# Patient Record
Sex: Female | Born: 1950 | Race: White | Hispanic: No | Smoking: Former smoker
Health system: Southern US, Community
[De-identification: ages and names within clinical notes are randomized; demographics above are authoritative.]

## PROBLEM LIST (undated history)

## (undated) DIAGNOSIS — I82409 Acute embolism and thrombosis of unspecified deep veins of unspecified lower extremity: Secondary | ICD-10-CM

## (undated) DIAGNOSIS — F329 Major depressive disorder, single episode, unspecified: Secondary | ICD-10-CM

## (undated) DIAGNOSIS — I499 Cardiac arrhythmia, unspecified: Secondary | ICD-10-CM

## (undated) DIAGNOSIS — F32A Depression, unspecified: Secondary | ICD-10-CM

## (undated) DIAGNOSIS — Z8639 Personal history of other endocrine, nutritional and metabolic disease: Secondary | ICD-10-CM

## (undated) DIAGNOSIS — I10 Essential (primary) hypertension: Secondary | ICD-10-CM

## (undated) DIAGNOSIS — F419 Anxiety disorder, unspecified: Secondary | ICD-10-CM

## (undated) DIAGNOSIS — Z86718 Personal history of other venous thrombosis and embolism: Secondary | ICD-10-CM

## (undated) DIAGNOSIS — I739 Peripheral vascular disease, unspecified: Secondary | ICD-10-CM

## (undated) HISTORY — PX: TUBAL LIGATION: SHX77

## (undated) HISTORY — DX: Acute embolism and thrombosis of unspecified deep veins of unspecified lower extremity: I82.409

## (undated) HISTORY — DX: Personal history of other endocrine, nutritional and metabolic disease: Z86.39

## (undated) HISTORY — PX: ANKLE SURGERY: SHX546

## (undated) HISTORY — DX: Personal history of other venous thrombosis and embolism: Z86.718

## (undated) HISTORY — PX: BREAST SURGERY: SHX581

---

## 1998-11-04 ENCOUNTER — Other Ambulatory Visit: Admission: RE | Admit: 1998-11-04 | Discharge: 1998-11-04 | Payer: Self-pay | Admitting: Radiology

## 1999-01-09 ENCOUNTER — Other Ambulatory Visit: Admission: RE | Admit: 1999-01-09 | Discharge: 1999-01-09 | Payer: Self-pay | Admitting: Radiology

## 1999-02-05 ENCOUNTER — Other Ambulatory Visit: Admission: RE | Admit: 1999-02-05 | Discharge: 1999-02-05 | Payer: Self-pay | Admitting: Surgery

## 1999-07-04 ENCOUNTER — Other Ambulatory Visit: Admission: RE | Admit: 1999-07-04 | Discharge: 1999-07-04 | Payer: Self-pay | Admitting: *Deleted

## 2003-10-11 ENCOUNTER — Ambulatory Visit: Admission: RE | Admit: 2003-10-11 | Discharge: 2003-10-11 | Payer: Self-pay | Admitting: Family Medicine

## 2003-10-12 ENCOUNTER — Encounter (HOSPITAL_COMMUNITY): Admission: RE | Admit: 2003-10-12 | Discharge: 2003-10-15 | Payer: Self-pay | Admitting: Oncology

## 2004-04-03 ENCOUNTER — Encounter: Admission: RE | Admit: 2004-04-03 | Discharge: 2004-04-03 | Payer: Self-pay | Admitting: Internal Medicine

## 2004-05-30 ENCOUNTER — Observation Stay (HOSPITAL_COMMUNITY): Admission: EM | Admit: 2004-05-30 | Discharge: 2004-05-31 | Payer: Self-pay | Admitting: Emergency Medicine

## 2005-03-09 ENCOUNTER — Ambulatory Visit (HOSPITAL_COMMUNITY): Admission: RE | Admit: 2005-03-09 | Discharge: 2005-03-09 | Payer: Self-pay | Admitting: Gastroenterology

## 2005-03-09 ENCOUNTER — Encounter (INDEPENDENT_AMBULATORY_CARE_PROVIDER_SITE_OTHER): Payer: Self-pay | Admitting: *Deleted

## 2005-06-15 ENCOUNTER — Encounter: Admission: RE | Admit: 2005-06-15 | Discharge: 2005-06-15 | Payer: Self-pay | Admitting: Internal Medicine

## 2006-04-16 IMAGING — CR DG KNEE COMPLETE 4+V*L*
4 series · 4 of 4 positions shown · non-contrast
Comparison: none

CLINICAL DATA: Fell 12 days ago.  Pain anterior knee and bruising from knee down to ankle.
 LEFT KNEE COMPLETE ? FOUR VIEWS:
 There is no evidence of fracture, dislocation, or other significant bone abnormality.  There is no evidence of joint effusion.

[view not recorded (1 of 4)]
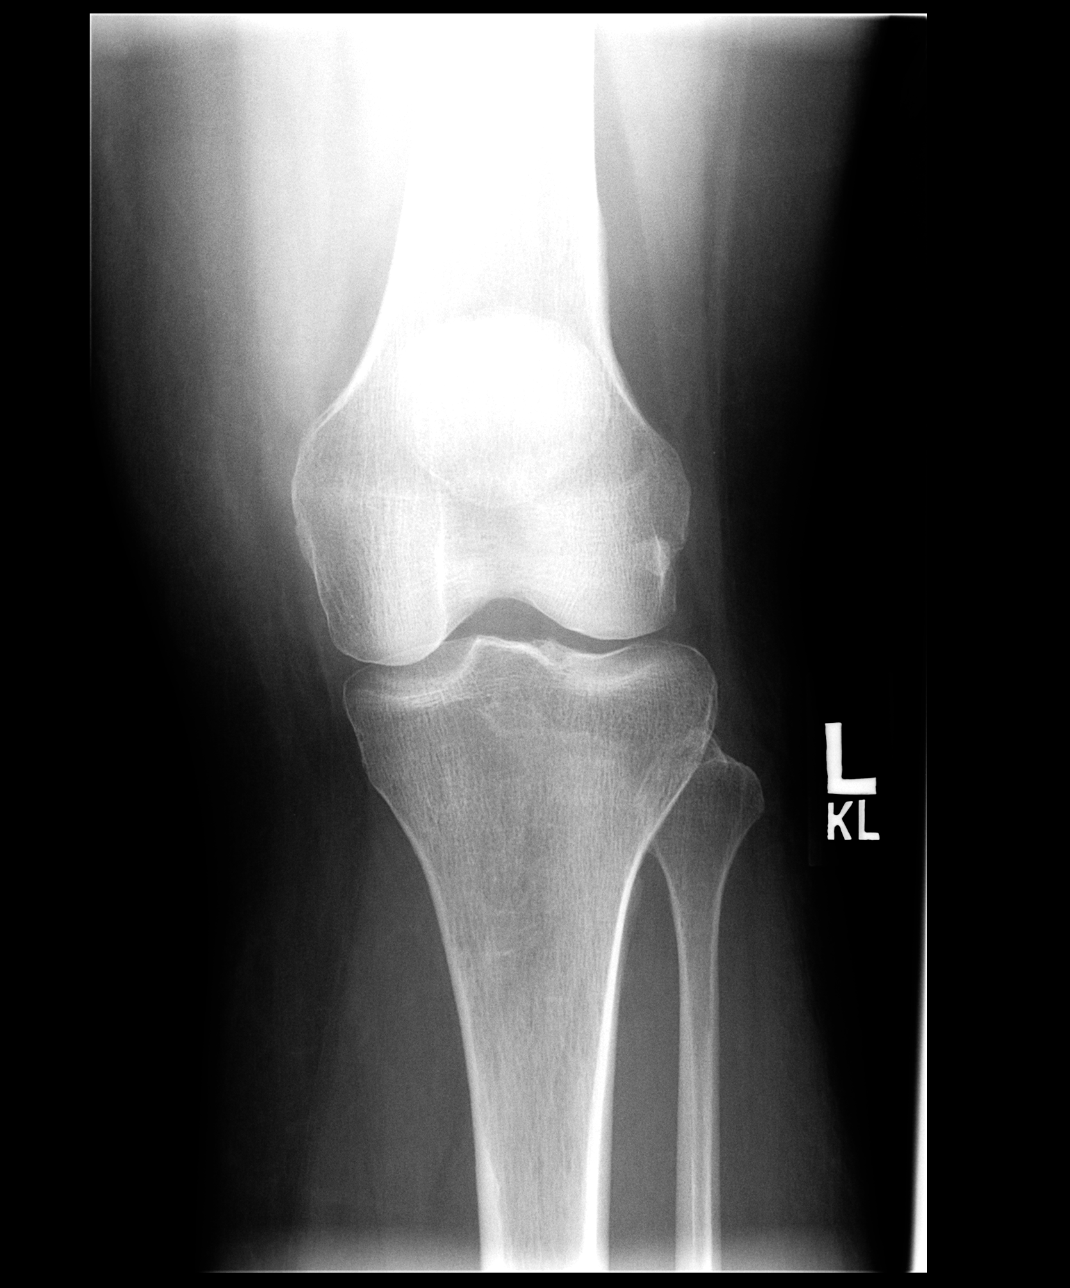

[view not recorded (2 of 4)]
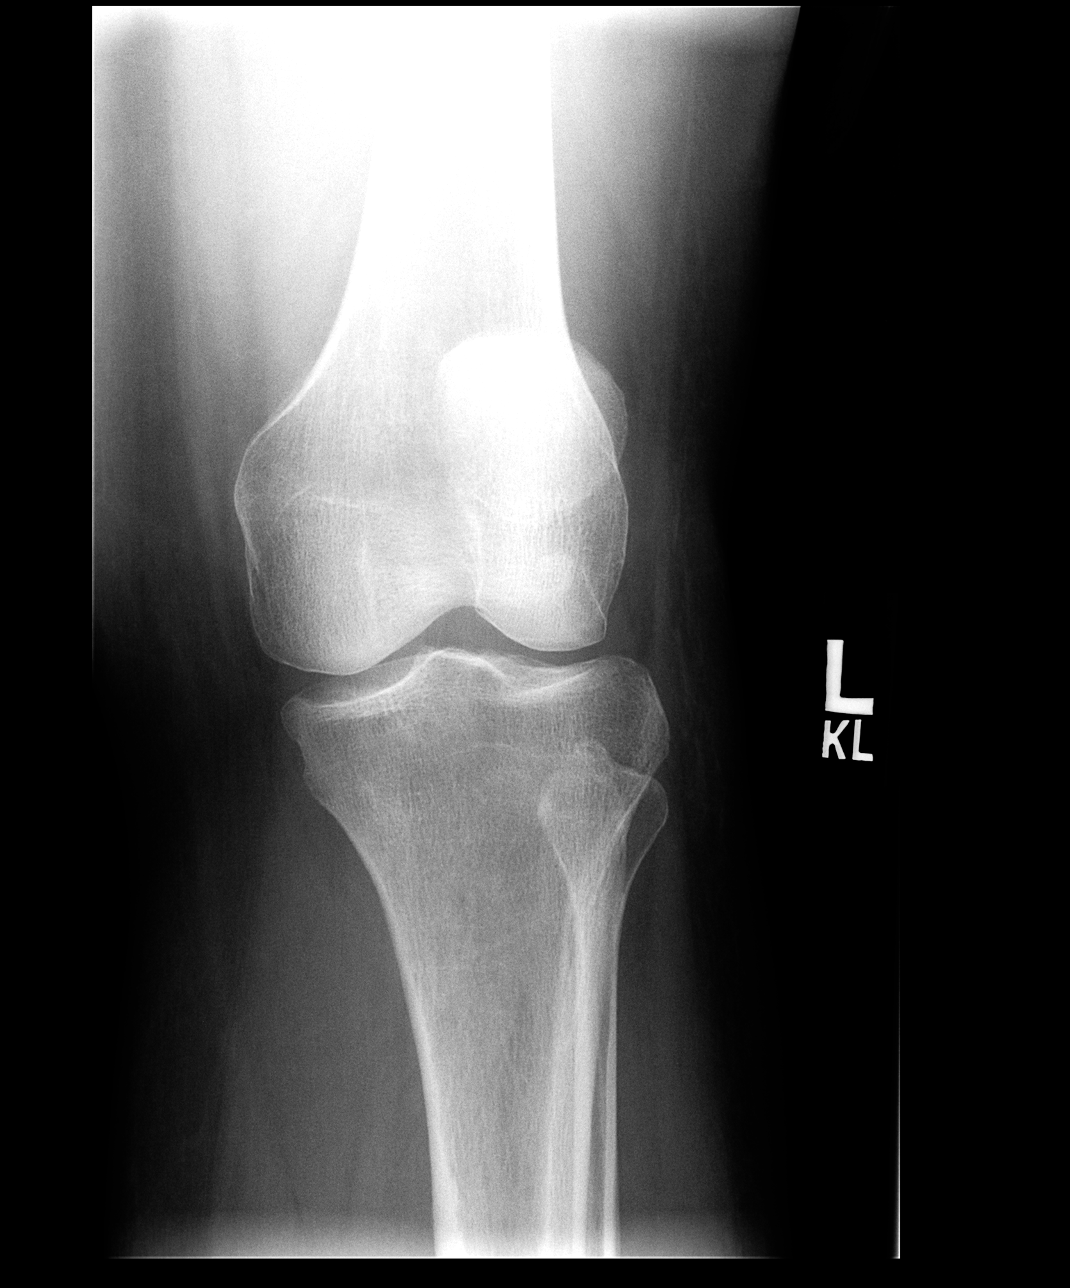

[view not recorded (3 of 4)]
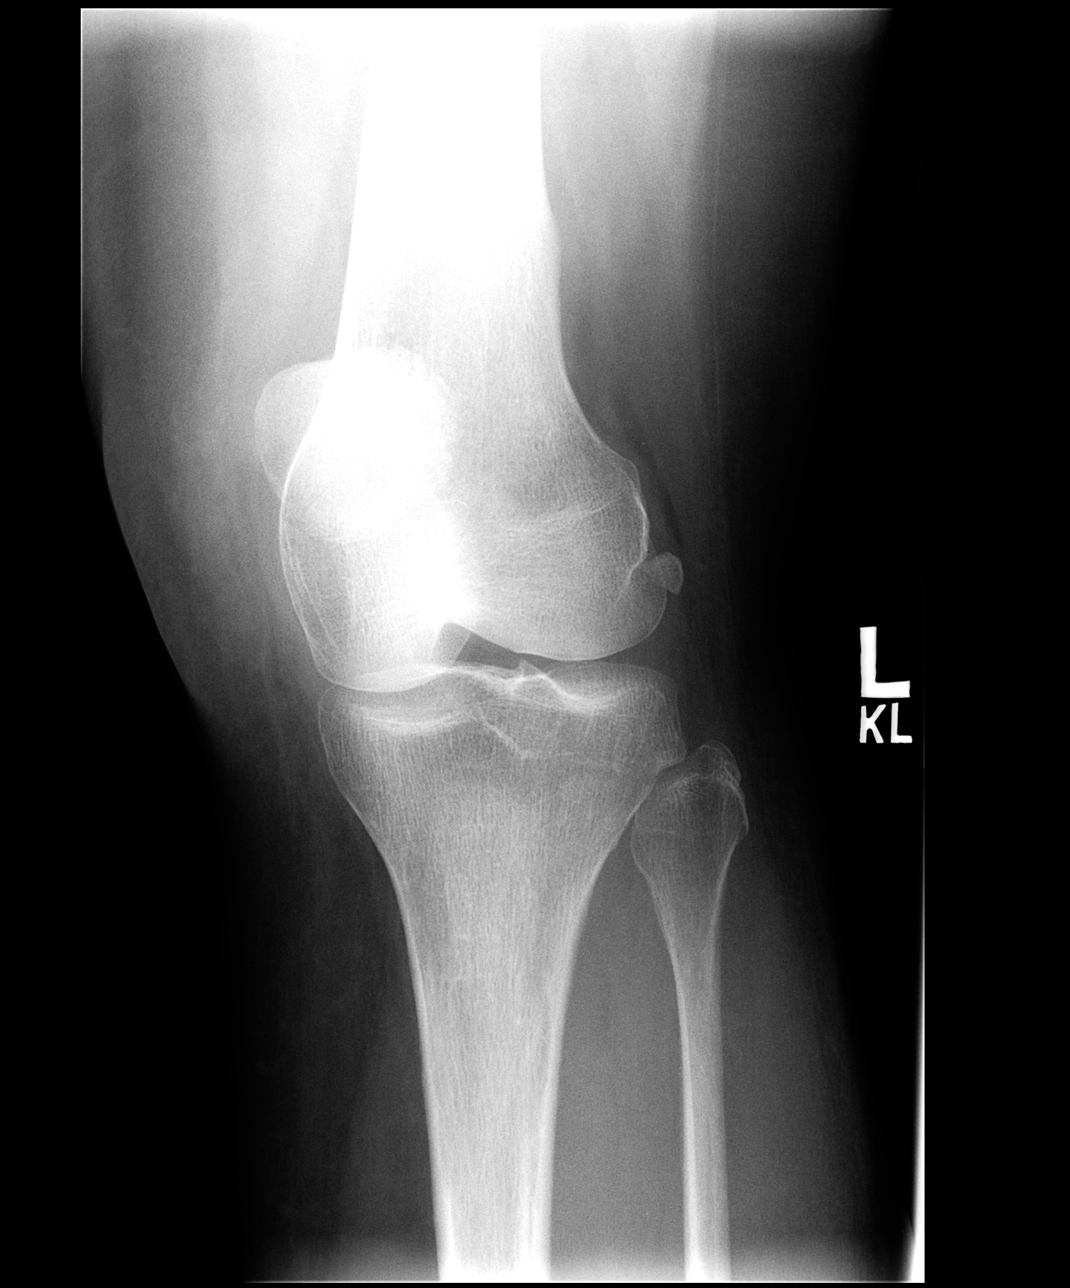

[view not recorded (4 of 4)]
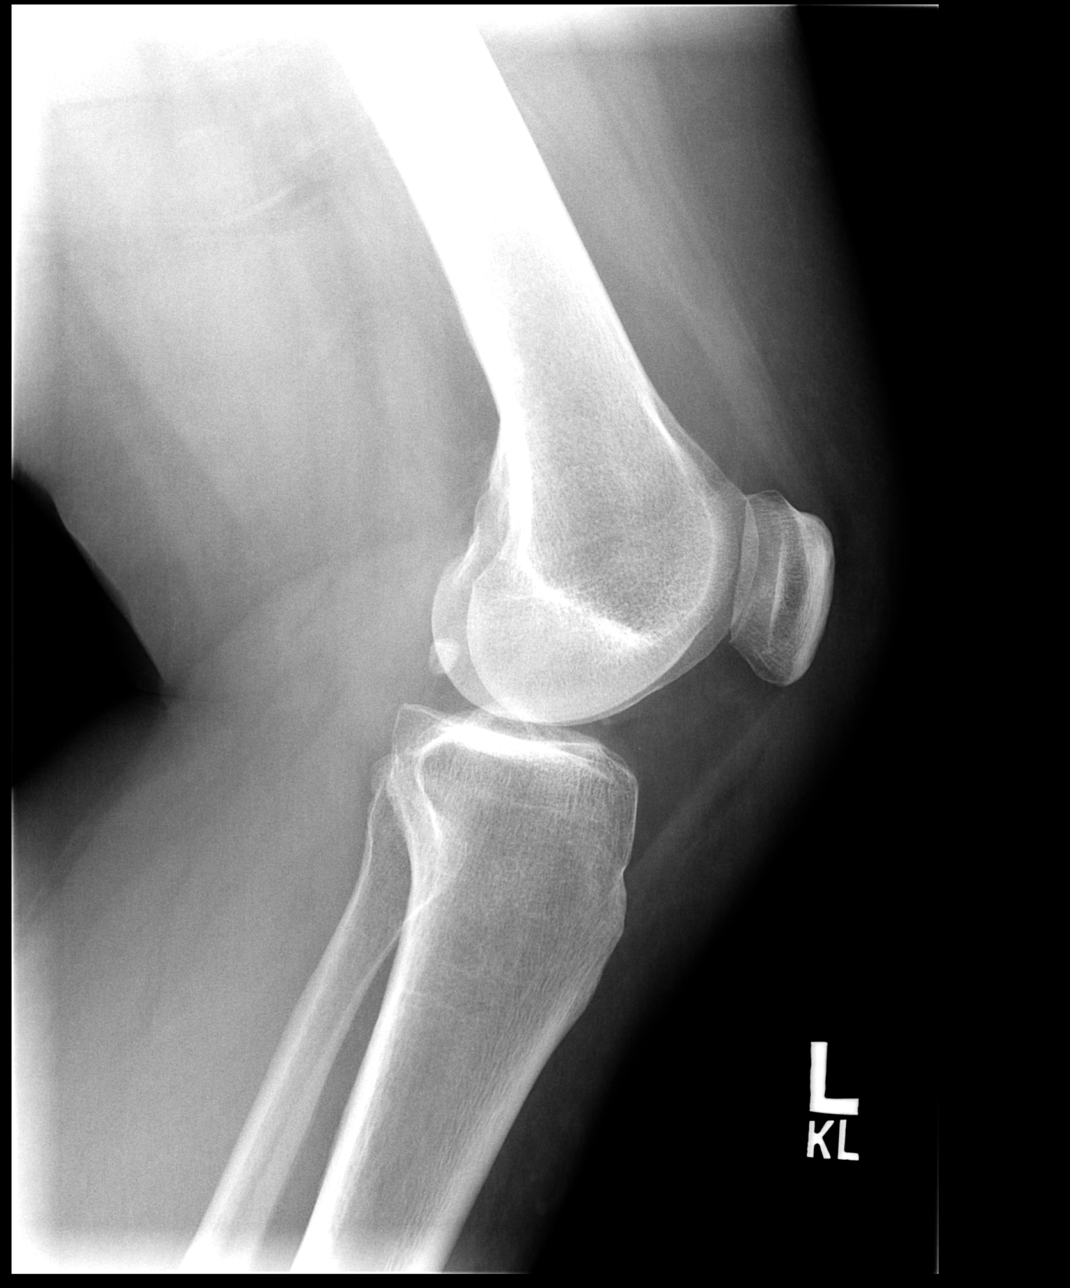

[4 of 4 positions shown; findings below may reference images not displayed]

IMPRESSION: Normal study.

## 2008-02-13 ENCOUNTER — Other Ambulatory Visit: Admission: RE | Admit: 2008-02-13 | Discharge: 2008-02-13 | Payer: Self-pay | Admitting: Obstetrics & Gynecology

## 2008-02-23 ENCOUNTER — Other Ambulatory Visit: Admission: RE | Admit: 2008-02-23 | Discharge: 2008-02-23 | Payer: Self-pay | Admitting: Obstetrics & Gynecology

## 2011-03-20 NOTE — H&P (Signed)
NAME:  Angelica Mclaughlin, Angelica Mclaughlin                           ACCOUNT NO.:  [id number]   MEDICAL RECORD NO.:  [id number]                   PATIENT TYPE:  EMS   LOCATION:  MAJO                                 FACILITY:  [facility]   PHYSICIAN:  Wilson Singer, M.D.             DATE OF BIRTH:  07/03/1951   DATE OF ADMISSION:  05/30/2004  DATE OF DISCHARGE:                                HISTORY & PHYSICAL   HISTORY:  This is a 60 year old lady who has a recent history of deep vein  thrombosis in December 2004, and is on Coumadin for this, now presents with  an episode of syncope.  She in fact relates a one year history of  palpitations associated with lightheadedness and the palpitations lasting  approximately 20 minutes.  She has never lost consciousness with any of the  episodes which occurred on a monthly basis, but on this occasion today she  did lose consciousness momentarily.  She denies any chest pain associated  with these palpitations.   PAST MEDICAL HISTORY:  Deep vein thrombosis on the left leg and also  previously on the right leg.  She has no other significant history.   MEDICATIONS:  1. Coumadin.  2. Effexor.   ALLERGIES:  No known drug allergies.   SOCIAL HISTORY:  She is a married lady who does not smoke and occasionally  drinks alcohol.  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   REVIEW OF SYSTEMS:  Apart from the symptoms mentioned above, there are no  other symptoms referable to the constitutional, cardiovascular, respiratory,  neurological, gastrointestinal, genitourinary, musculoskeletal, neurological  systems.   PHYSICAL EXAMINATION:  GENERAL:  She looks systemically well.  VITAL SIGNS:  She is afebrile.  She is hemodynamically stable.  HEART:  Heart sounds are present and normal.  There are no murmurs.  NECK:  No carotids.  LUNGS:  Lung fields are clear.  NEUROLOGIC:  She is alert and oriented with no focal neurological signs.   LABORATORY DATA:   Electrocardiogram shows normal sinus rhythm and is within  normal limits.  Blood work, including blood count and electrolytes are  reportedly to be normal, but I have not seen them myself yet, and will  search for these results.  Apparently, a CT scan of the brain was normal.   IMPRESSION AND PLAN:  Syncopal episode associated with palpitations.  I am  concerned that she does have a significant arrhythmia and requires morphine.  I discussed this patient with Dr. Amil Amen of cardiology who will see her  tomorrow and we will admit her overnight for telemetry monitoring.  We will  get serial cardiac enzymes to make sure that she has not had any ischemic  event.  Wilson Singer, M.D.    NCG/MEDQ  D:  05/30/2004  T:  05/30/2004  Job:  [id number]   cc:   Olene Craven, M.D.  [address]  [address]  [place]  [place] [address]  Fax: [phone]

## 2011-03-20 NOTE — Op Note (Signed)
[name], Angelica Mclaughlin                 ACCOUNT NO.:  [id number]   MEDICAL RECORD NO.:  [id number]          PATIENT TYPE:  AMB   LOCATION:  ENDO                         FACILITY:  [facility]   PHYSICIAN:  Anselmo Rod, M.D.  DATE OF BIRTH:  1951-01-01   DATE OF PROCEDURE:  03/09/2005  DATE OF DISCHARGE:                                 OPERATIVE REPORT   PROCEDURE PERFORMED:  Colonoscopy with snare polypectomy times one.   ENDOSCOPIST:  Charna Elizabeth, M.D.   INSTRUMENT USED:  Olympus video colonoscope.   INDICATIONS FOR PROCEDURE:  60 year old white female undergoing screening  colonoscopy to rule out colonic polyps, masses, etc.   PREPROCEDURE PREPARATION:  Informed consent was procured from the patient.  The patient was fasted for eight hours prior to the procedure and prepped  with a bottle of magnesium citrate and a gallon of GoLYTELY the night prior  to the procedure.  The risks and benefits of the procedure including a 10%  miss rate for polyps or cancers was discussed with the patient as well.  Her  Coumadin was held for five days prior to the procedure after procuring  permission from Dr. Barbee Shropshire to do so.   PREPROCEDURE PHYSICAL:  The patient had stable vital signs.  Neck supple.  Chest clear to auscultation.  S1 and S2 regular.  Abdomen soft with normal  bowel sounds.   DESCRIPTION OF PROCEDURE:  The patient was placed in left lateral decubitus  position and sedated with 100 mg of Demerol and 8 mg of Versed in slow  incremental doses given intravenously.  Once the patient was adequately  sedated and maintained on low flow oxygen and continuous cardiac monitoring,  the Olympus video colonoscope was advanced from the rectum to the cecum.  The appendicular orifice and ileocecal valve were visualized and  photographed.  There was some stool in the right side of the colon.  Multiple washes were done.  Small lesions could be missed.  A few scattered  diverticula were seen  throughout the colon.  A pedunculated polyp measuring  5 to 6 mm was snared from the rectosigmoid colon at 20 cm.  Small internal  hemorrhoids were seen on retroflexion.  The rest of the exam was  unremarkable. The patient tolerated the procedure well without immediate  complication.   IMPRESSION:  1.  Small nonbleeding internal hemorrhoids seen on retroflexion.  2.  Few scattered diverticula throughout the colon.  3.  Pedunculated polyp measuring 5 to 6 mm snared from 20 cm.  4.  Significant amount of residual stool in the right colon, multiple washes      done.  Small lesions could be missed.  5.  A pill seen in the cecal base   RECOMMENDATIONS:  1.  Await pathology results.  2.  Hold off on the Coumadin for another week.  3.  Outpatient followup as need arises in the future.  Repeat colonoscopy      depending on pathology results.      JNM/MEDQ  D:  03/09/2005  T:  03/09/2005  Job:  [id number]  cc:   Olene Craven, M.D.  [address]  [address]  [place]  [place] [address]  Fax: [phone]

## 2011-03-20 NOTE — Consult Note (Signed)
NAME:  Angelica Mclaughlin, Angelica Mclaughlin                           ACCOUNT NO.:  [id number]   MEDICAL RECORD NO.:  [id number]                   PATIENT TYPE:  INP   LOCATION:  5511                                 FACILITY:  [facility]   PHYSICIAN:  Francisca December, M.D.               DATE OF BIRTH:  Jun 19, 1951   DATE OF CONSULTATION:  05/31/2004  DATE OF DISCHARGE:                                   CONSULTATION   REASON FOR CONSULTATION:  Syncope.   HISTORY OF PRESENT ILLNESS:  Angelica Mclaughlin is a pleasant 60 year old woman who  yesterday noted the abrupt onset of tachypalpitations.  This is not a new  sensation for her.  She laid down in bed as she usually does, hoping that it  would resolve.  She abruptly got up to escort a guest to the door and in  doing so prompted an episode of syncope.  She fell to the floor and caused  epistaxis with some minor trauma around the orbit.  She does not remember  hitting the floor.  She remembers awakening on the floor with a bloody nose.  EMS was on the way.  She was transported to the emergency room with stable  vital signs.  She was admitted overnight for monitoring and rule out of  myocardial infarction, which has been accomplished.  Overnight telemetry has  not revealed any dysrhythmia.   She has been having these episodes of tachypalpitations for about a year.  They have become more frequent.  They had been happening once a month or  every other month.  Now in the last six to eight weeks, they have been  occurring one to two times per week.  As mentioned, they usually resolve  with her laying down and resting over about 15-20 minutes.  They are not  associated with dyspnea or chest discomfort.  She has noted lightheadedness  during the tachypalpitations, but not following resolution of this  sensation.   PAST MEDICAL HISTORY:  1. Hypertension.  2. History of deep venous thrombosis on systemic anticoagulation.  The last     episode was in December of 2004.  3.  Depression.   SURGICAL PROCEDURES:  Ankle surgery remotely.   CURRENT MEDICATIONS:  1. Warfarin, varying dosages.  2. Effexor, ? dose one p.o. daily.  3. Benicar 20 mg p.o. daily.   FAMILY HISTORY:  Not significant for early cardiac disease.   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  She quit smoking  in December of 2004.  She drinks one cocktail per night.  She lives with her  husband.   DRUG ALLERGIES:  None known.   REVIEW OF SYSTEMS:  Negative, except for as mentioned above.   PHYSICAL EXAMINATION:  VITAL SIGNS:  The blood pressure is 133/97, pulse is  70 and regular, respiratory rate 18, and temperature 97.2 degrees.  GENERAL APPEARANCE:  This  is a well-appearing 60 year old woman in no  distress.  HEENT:  Unremarkable.  The head is normocephalic and atraumatic.  The pupils  are equal, round, and reactive to light and accommodation.  Extraocular  movements are intact.  The oral mucosa is pink and moist.  The tongue is not  coated.  NECK:  Supple without thyromegaly or masses.  The carotid upstrokes are  normal.  There is no bruit.  There is no jugular venous distention.  CHEST:  Clear with adequate excursion.  Normal vesicular breath sounds are  heard throughout.  The precordium is quiet.  Normal S1 and S2.  No S3, S4,  murmur, click, or rub noted.  The PMI is not palpable.  ABDOMEN:  Flat, soft, and nontender without any hepatosplenomegaly or  midline pulsatile mass.  EXTREMITIES:  The lower extremities are without edema.  Distal pulses are  intact.  There is full range of motion.  NEUROLOGIC:  Cranial nerves II-XII are intact.  Motor and sensory grossly  intact.  Gait not tested.  SKIN:  Warm, dry, and clear.   ACCESSORY CLINICAL DATA:  The admission hemogram, serum electrolytes, BUN,  creatinine, and glucose are normal.  The INR is 1.8 and PT 18.5.  Liver  associated enzymes were normal.  CK-MB and troponin negative x 3.  Calcium  8.8.  TSH not  obtained.   Electrocardiogram:  Normal sinus rhythm.  Normal EKG.   ASSESSMENT:  1. Likely supraventricular tachycardia with associated hypotension and     cerebral hypoperfusion.  This was exacerbated yesterday by abrupt move     from a supine to the standing position.  It is most likely an AV nodal re-     entry tachycardia.  Atrial fibrillation cannot be ruled out.  2. She has normal electrocardiogram.  No evidence of WPW.  3. History of deep venous thrombosis on warfarin.  4. Hypertension.   PLAN:  1. Would proceed with discharge per your recommendation.  2. The patient is to call my office the a.m. of June 02, 2004, for     application of event monitor and to be scheduled for 2-D echocardiogram.  3. Follow up with myself in two to three weeks in the office for the use and     the plan behind event telemetry monitoring were described.  She knows to     record an episode of tachypalpitations if she feels this.  It can then be     telephoned into the office.  Once a diagnosis is made, we can proceed     with treatment, which is either oral therapy usually with beta blocker or     possibly catheter based ablation.  Of course, this all depends on the     type of rhythm discovered.   Thank you very much for allowing me to assist in the care of [name].  It has been a pleasure to do so.  I will discuss her further care  with you.                                               Francisca December, M.D.   JHE/MEDQ  D:  05/31/2004  T:  05/31/2004  Job:  [id number]   cc:   Olene Craven, M.D.  [address]  [address]  [place]  [place] [address]  Fax: [phone]

## 2011-05-03 HISTORY — PX: HYSTEROSCOPY WITH D & C: SHX1775

## 2011-05-04 ENCOUNTER — Encounter (HOSPITAL_COMMUNITY)
Admission: RE | Admit: 2011-05-04 | Discharge: 2011-05-04 | Disposition: A | Discharge status: Home / Self Care | Payer: 59 | Source: Ambulatory Visit | Attending: Obstetrics & Gynecology | Admitting: Obstetrics & Gynecology

## 2011-05-04 ENCOUNTER — Encounter (HOSPITAL_COMMUNITY): Payer: Self-pay

## 2011-05-04 HISTORY — DX: Depression, unspecified: F32.A

## 2011-05-04 HISTORY — DX: Major depressive disorder, single episode, unspecified: F32.9

## 2011-05-04 HISTORY — DX: Anxiety disorder, unspecified: F41.9

## 2011-05-04 HISTORY — DX: Peripheral vascular disease, unspecified: I73.9

## 2011-05-04 HISTORY — DX: Essential (primary) hypertension: I10

## 2011-05-04 HISTORY — DX: Cardiac arrhythmia, unspecified: I49.9

## 2011-05-04 LAB — CBC
HCT: 40.7 % (ref 36.0–46.0)
MCHC: 34.4 g/dL (ref 30.0–36.0)
Platelets: 224 10*3/uL (ref 150–400)
RBC: 4.19 MIL/uL (ref 3.87–5.11)
RDW: 13.9 % (ref 11.5–15.5)

## 2011-05-04 LAB — BASIC METABOLIC PANEL
BUN: 16 mg/dL (ref 6–23)
CO2: 27 mEq/L (ref 19–32)
GFR calc Af Amer: >60 mL/min (ref >60)
Potassium: 4.3 mEq/L (ref 3.5–5.1)

## 2011-05-10 MED ORDER — CEFAZOLIN SODIUM-DEXTROSE 2-3 GM-% IV SOLR
2.0000 g | INTRAVENOUS | Status: DC
  Filled 2011-05-10: qty 50

## 2011-05-11 ENCOUNTER — Encounter (HOSPITAL_COMMUNITY)
Admission: RE | Disposition: A | Discharge status: Home / Self Care | Payer: Self-pay | Source: Ambulatory Visit | Attending: Obstetrics & Gynecology

## 2011-05-11 ENCOUNTER — Ambulatory Visit (HOSPITAL_COMMUNITY): Payer: 59 | Admitting: Certified Registered"

## 2011-05-11 ENCOUNTER — Encounter (HOSPITAL_COMMUNITY): Payer: Self-pay | Admitting: Certified Registered"

## 2011-05-11 ENCOUNTER — Other Ambulatory Visit: Payer: Self-pay | Admitting: Obstetrics & Gynecology

## 2011-05-11 ENCOUNTER — Ambulatory Visit (HOSPITAL_COMMUNITY)
Admission: RE | Admit: 2011-05-11 | Discharge: 2011-05-11 | Disposition: A | Discharge status: Home / Self Care | Payer: 59 | Source: Ambulatory Visit | Attending: Obstetrics & Gynecology | Admitting: Obstetrics & Gynecology

## 2011-05-11 DIAGNOSIS — I10 Essential (primary) hypertension: Secondary | ICD-10-CM | POA: Insufficient documentation

## 2011-05-11 DIAGNOSIS — N840 Polyp of corpus uteri: Secondary | ICD-10-CM | POA: Insufficient documentation

## 2011-05-11 DIAGNOSIS — Z01818 Encounter for other preprocedural examination: Secondary | ICD-10-CM | POA: Insufficient documentation

## 2011-05-11 DIAGNOSIS — Z86718 Personal history of other venous thrombosis and embolism: Secondary | ICD-10-CM | POA: Insufficient documentation

## 2011-05-11 DIAGNOSIS — R87619 Unspecified abnormal cytological findings in specimens from cervix uteri: Secondary | ICD-10-CM | POA: Insufficient documentation

## 2011-05-11 DIAGNOSIS — Z01812 Encounter for preprocedural laboratory examination: Secondary | ICD-10-CM | POA: Insufficient documentation

## 2011-05-11 SURGERY — DILATATION & CURETTAGE/HYSTEROSCOPY WITH RESECTOCOPE
Anesthesia: General | Site: Uterus | Wound class: Clean Contaminated

## 2011-05-11 MED ORDER — ONDANSETRON HCL 4 MG/2ML IJ SOLN
INTRAMUSCULAR | Status: AC
  Filled 2011-05-11: qty 2

## 2011-05-11 MED ORDER — PROPOFOL 10 MG/ML IV EMUL
INTRAVENOUS | Status: DC | PRN
  Administered 2011-05-11: 200 mg via INTRAVENOUS

## 2011-05-11 MED ORDER — LACTATED RINGERS IV SOLN: INTRAVENOUS | Status: DC

## 2011-05-11 MED ORDER — LIDOCAINE-EPINEPHRINE 1 %-1:100000 IJ SOLN
INTRAMUSCULAR | Status: DC | PRN
  Administered 2011-05-11: 10 mL

## 2011-05-11 MED ORDER — CEFAZOLIN SODIUM 1-5 GM-% IV SOLN: 2.0000 g | Freq: Once | INTRAVENOUS | Status: DC

## 2011-05-11 MED ORDER — FENTANYL CITRATE 0.05 MG/ML IJ SOLN
INTRAMUSCULAR | Status: DC | PRN
  Administered 2011-05-11 (×2): 50 ug via INTRAVENOUS

## 2011-05-11 MED ORDER — MIDAZOLAM HCL 2 MG/2ML IJ SOLN
INTRAMUSCULAR | Status: AC
  Filled 2011-05-11: qty 2

## 2011-05-11 MED ORDER — CEFAZOLIN SODIUM 1-5 GM-% IV SOLN
INTRAVENOUS | Status: DC | PRN
  Administered 2011-05-11: 2 g via INTRAVENOUS

## 2011-05-11 MED ORDER — FENTANYL CITRATE 0.05 MG/ML IJ SOLN
INTRAMUSCULAR | Status: AC
  Filled 2011-05-11: qty 2

## 2011-05-11 MED ORDER — PHENYLEPHRINE 40 MCG/ML (10ML) SYRINGE FOR IV PUSH (FOR BLOOD PRESSURE SUPPORT)
PREFILLED_SYRINGE | INTRAVENOUS | Status: AC
  Filled 2011-05-11: qty 10

## 2011-05-11 MED ORDER — LIDOCAINE HCL (CARDIAC) 20 MG/ML IV SOLN
INTRAVENOUS | Status: DC | PRN
  Administered 2011-05-11: 50 mg via INTRAVENOUS

## 2011-05-11 MED ORDER — PHENYLEPHRINE HCL 10 MG/ML IJ SOLN
INTRAMUSCULAR | Status: DC | PRN
  Administered 2011-05-11 (×2): 40 ug via INTRAVENOUS

## 2011-05-11 MED ORDER — LIDOCAINE HCL (CARDIAC) 20 MG/ML IV SOLN
INTRAVENOUS | Status: AC
  Filled 2011-05-11: qty 5

## 2011-05-11 MED ORDER — ONDANSETRON HCL 4 MG/2ML IJ SOLN
INTRAMUSCULAR | Status: DC | PRN
  Administered 2011-05-11: 4 mg via INTRAMUSCULAR

## 2011-05-11 MED ORDER — PROPOFOL 10 MG/ML IV EMUL
INTRAVENOUS | Status: AC
  Filled 2011-05-11: qty 20

## 2011-05-11 MED ORDER — MIDAZOLAM HCL 5 MG/5ML IJ SOLN
INTRAMUSCULAR | Status: DC | PRN
  Administered 2011-05-11: 2 mg via INTRAVENOUS

## 2011-05-11 MED ORDER — GLYCINE 1.5 % IR SOLN
Status: DC | PRN
  Administered 2011-05-11: 3000 mL

## 2011-05-11 MED ORDER — LACTATED RINGERS IV SOLN
INTRAVENOUS | Status: DC | PRN
  Administered 2011-05-11: 11:00:00 via INTRAVENOUS

## 2011-05-11 MED ORDER — DEXAMETHASONE SODIUM PHOSPHATE 10 MG/ML IJ SOLN
INTRAMUSCULAR | Status: DC | PRN
  Administered 2011-05-11: 4 mg via INTRAVENOUS

## 2011-05-11 MED ORDER — KETOROLAC TROMETHAMINE 30 MG/ML IJ SOLN
INTRAMUSCULAR | Status: AC
  Administered 2011-05-11: 30 mg via INTRAVENOUS
  Filled 2011-05-11: qty 1

## 2011-05-11 SURGICAL SUPPLY — 19 items
CANISTER SUCTION 2500CC (MISCELLANEOUS) ×2 IMPLANT
CATH ROBINSON RED A/P 16FR (CATHETERS) ×2 IMPLANT
CLOTH BEACON ORANGE TIMEOUT ST (SAFETY) ×2 IMPLANT
CONTAINER PREFILL 10% NBF 60ML (FORM) ×4 IMPLANT
DILATOR CANAL MILEX (MISCELLANEOUS) IMPLANT
DRAPE UTILITY XL STRL (DRAPES) IMPLANT
ELECT REM PT RETURN 9FT ADLT (ELECTROSURGICAL) ×2
ELECTRODE REM PT RTRN 9FT ADLT (ELECTROSURGICAL) ×1 IMPLANT
ELECTRODE ROLLER BARREL 22FR (ELECTROSURGICAL) IMPLANT
ELECTRODE VAPORCUT 22FR (ELECTROSURGICAL) IMPLANT
GLOVE BIOGEL PI IND STRL 7.0 (GLOVE) ×1 IMPLANT
GLOVE BIOGEL PI INDICATOR 7.0 (GLOVE) ×1
GLOVE ECLIPSE 6.5 STRL STRAW (GLOVE) ×4 IMPLANT
GOWN BRE IMP SLV AUR LG STRL (GOWN DISPOSABLE) ×2 IMPLANT
GOWN BRE IMP SLV AUR XL STRL (GOWN DISPOSABLE) ×2 IMPLANT
LOOP ANGLED CUTTING 22FR (CUTTING LOOP) IMPLANT
PACK HYSTEROSCOPY LF (CUSTOM PROCEDURE TRAY) ×2 IMPLANT
TOWEL OR 17X24 6PK STRL BLUE (TOWEL DISPOSABLE) ×4 IMPLANT
WATER STERILE IRR 1000ML POUR (IV SOLUTION) ×2 IMPLANT

## 2011-05-11 NOTE — Anesthesia Preprocedure Evaluation (Addendum)
Anesthesia Evaluation  Name, MR# and DOB Patient awake  General Assessment Comment  Reviewed: Allergy & Precautions, H&P , Patient's Chart, lab work & pertinent test results and reviewed documented beta blocker date and time   History of Anesthesia Complications (+) MALIGNANT HYPERTHERMIA  Airway Mallampati: II TM Distance: >3 FB Neck ROM: full    Dental No notable dental hx    Pulmonaryneg pulmonary ROS    clear to auscultation  pulmonary exam normal   Cardiovascular hypertension (Diastolic good today), + dysrhythmias (no sx with meds) Supra Ventricular Tachycardia regular Normal   Neuro/PsychNegative Neurological ROS Negative Psych ROS  GI/Hepatic/Renal negative GI ROS, negative Liver ROS, and negative Renal ROS (+)       Endo/Other  Negative Endocrine ROS (+)   Abdominal   Musculoskeletal  Hematology negative hematology ROS (+)   Peds  Reproductive/Obstetrics          Anesthesia Physical Anesthesia Plan  ASA: III  Anesthesia Plan: General   Post-op Pain Management:    Induction: Intravenous  Airway Management Planned: LMA  Additional Equipment:   Intra-op Plan:   Post-operative Plan: Extubation in OR  Informed Consent: I have reviewed the patients History and Physical, chart, labs and discussed the procedure including the risks, benefits and alternatives for the proposed anesthesia with the patient or authorized representative who has indicated his/her understanding and acceptance.   Dental Advisory Given  Plan Discussed with: CRNA  Anesthesia Plan Comments:        Anesthesia Quick Evaluation

## 2011-05-11 NOTE — Brief Op Note (Signed)
05/11/2011  12:03 PM  PATIENT:  Angelica Mclaughlin  60 y.o. female with atypical endometrial cells on pap, endometrial biopsy 04/08/11 with polyp but otherwise benign tissue  PRE-OPERATIVE DIAGNOSIS:  abnormal pap smear; endometrial polyps  POST-OPERATIVE DIAGNOSIS:  same  PROCEDURE:  Procedure(s): DILATATION & CURRETTAGE/HYSTEROSCOPY WITH RESECTOCOPE  SURGEON:  Surgeon(s): M Leda Quail  PHYSICIAN ASSISTANT:   ASSISTANTS: none   ANESTHESIA:   general, LMA  ESTIMATED BLOOD LOSS: * No blood loss amount entered *   BLOOD ADMINISTERED:none  DRAINS: none   LOCAL MEDICATIONS USED:  LIDOCAINE 1% mixed 1:1 with epinephrine (1:100,000U).  10CC total  SPECIMEN:  Source of Specimen:  endometrial curretings and polyp  DISPOSITION OF SPECIMEN:  PATHOLOGY  COUNTS:  YES  TOURNIQUET:  * No tourniquets in log *  DICTATION #: [id number]  PLAN OF CARE: to PACU for discharge home  PATIENT DISPOSITION:  PACU - hemodynamically stable.

## 2011-05-11 NOTE — H&P (Signed)
Angelica Mclaughlin is an 60 y.o. female with Atypical Endometrial cells noted on Pap smear.  Patient seen in office on April 08, 2011.  SHG with biopsy performed.  Uterus 6 x 4 x 4cm with three fibroids noted.  Thickened irregular endometrium noted.  Biopsy showed polyp with no abnormal cells.  Because of Pap smear finding and thickened endometrium on U/S, hysteroscopy with polypectomy and D&C recommended.  Pertinent Gynecological History: Menses: post-menopausal Bleeding: none Contraception: tubal ligation DES exposure: denies Blood transfusions: none Sexually transmitted diseases: no past history Previous GYN Procedures: none  Last mammogram: normal Date: Nov 2011 Last pap: abnormal: atypical endometrial cells  Date: April 2012 OB History: G0, P0   Menstrual History: Menarche age: 62 No LMP recorded. Patient is postmenopausal.    Past Medical History  Diagnosis Date  . Dysrhythmia   . Hypertension   . Peripheral vascular disease     hx DVT '04  . Anxiety   . Depression     Past Surgical History  Procedure Date  . Breast surgery '90's    No family history on file.  Social History:  reports that she has been passively smoking.  She does not have any smokeless tobacco history on file. She reports that she drinks alcohol. She reports that she does not use illicit drugs.  Allergies: No Known Allergies  Prescriptions prior to admission  Medication Sig Dispense Refill  . DULoxetine (CYMBALTA) 60 MG capsule Take 120 mg by mouth daily.        Marland Kitchen ezetimibe (ZETIA) 10 MG tablet Take 10 mg by mouth daily.        . metoprolol (TOPROL-XL) 50 MG 24 hr tablet Take 50 mg by mouth daily.        Marland Kitchen olmesartan-hydrochlorothiazide (BENICAR HCT) 20-12.5 MG per tablet Take 1 tablet by mouth daily.        . vitamin B-12 (CYANOCOBALAMIN) 1000 MCG tablet Take 1,000 mcg by mouth daily.        . vitamin E 400 UNIT capsule Take 400 Units by mouth daily.        Marland Kitchen warfarin (COUMADIN) 5 MG tablet Take 2.5-5  mg by mouth daily. PATIENT TAKES 2.5MG  ON FRIDAYS AND 5 MG ALL OTHER DAYS         Review of Systems  Constitutional: Negative.   Eyes: Negative.   Cardiovascular: Negative for chest pain and palpitations.  Gastrointestinal: Negative for heartburn, nausea, vomiting and abdominal pain.  Genitourinary: Negative for dysuria.  Skin: Negative.   Neurological: Negative.   Psychiatric/Behavioral: Negative.     Blood pressure 110/84, pulse 87, temperature 98.2 F (36.8 C), temperature source Oral, resp. rate 18, SpO2 98.00%. Physical Exam  Constitutional: She is oriented to person, place, and time. She appears well-developed and well-nourished.  HENT:  Head: Normocephalic and atraumatic.  Neck: Normal range of motion.  Cardiovascular: Normal rate and regular rhythm.   Respiratory: Effort normal and breath sounds normal.  GI: Soft. Bowel sounds are normal.  Genitourinary: Vagina normal and uterus normal.  Neurological: She is alert and oriented to person, place, and time.  Skin: Skin is warm and dry.  Psychiatric: She has a normal mood and affect.    No results found for this or any previous visit (from the past 24 hour(s)).  No results found.  Assessment/Plan: 60 year old G0 MWF with atypical endometrial cells on pap.  Hysteroscopy, polypectomy and D&C planned.  Reneshia Zuccaro,M SUZANNE 05/11/2011, 11:06 AM

## 2011-05-11 NOTE — Anesthesia Postprocedure Evaluation (Signed)
Immediate Anesthesia Transfer of Care Note  Patient: Angelica Mclaughlin  Procedure(s) Performed:  DILATATION & CURRETTAGE/HYSTEROSCOPY WITH RESECTOCOPE  Patient Location: PACU  Anesthesia Type: General  Level of Consciousness: awake, alert  and oriented  Airway & Oxygen Therapy: Patient Spontanous Breathing and Patient connected to face mask oxygen  Post-op Assessment: Report given to PACU RN and Post -op Vital signs reviewed and stable  Post vital signs: Reviewed and stable  Complications: No apparent anesthesia complications

## 2011-05-12 NOTE — Op Note (Signed)
NAME:  Angelica Mclaughlin, Angelica Mclaughlin                      ACCOUNT NO.:  MEDICAL RECORD NO.:  [id number]  LOCATION:                                 FACILITY:  PHYSICIAN:  M. Leda Quail, MD  DATE OF BIRTH:  Jul 02, 1951  DATE OF PROCEDURE: DATE OF DISCHARGE:                              OPERATIVE REPORT   PREOPERATIVE DIAGNOSES: 78. A 60 year old G0 married white female with history of atypical     endometrial cells noted on Pap with her regular screening exam. 2. Endometrial biopsy done in the office showing endometrial polyp. 3. History of deep vein thrombosis secondary to hormone replacement     therapy. 4. Elevated lipids. 5. Hypertension.  POSTOPERATIVE DIAGNOSES: 2. A 60 year old G0 married white female with history of atypical     endometrial cells noted on Pap with her regular screening exam. 2. Endometrial biopsy done in the office showing endometrial polyp. 3. History of deep vein thrombosis secondary to hormone replacement     therapy. 4. Elevated lipids. 5. Hypertension.  PROCEDURE:  Hysteroscopy, polyp resection, D and C.  ESTIMATED BLOOD LOSS:  Minimal.  FLUIDS:  1000 mL of LR.  URINE OUTPUT:  75 mL drained with an I and O catheterization at the beginning of the procedure.  FLUID DEFICIT:  55 mL of 1.5% Glycine  SURGEON:  M. Leda Quail, MD  ASSISTANT:  OR staff.  INDICATIONS:  Ms. Madewell is a very nice 60 year old G0 married white female with a history of abnormal Pap smear showing atypical endometrial cells.  She came in for an ultrasound which showed a thickened endometrium at 13 mm and an endometrial biopsy was performed.  She does have several fibroids that were noted and a submucosal fibroid but I did not believe that the thickened endometrium on ultrasound was completely due to the fibroid. There was a significant irregularity to the endometrial lining and a biopsy was performed.  The biopsy showed no abnormal tissue and just a polyp.  However, because of her  pathology on her Pap I felt that it was important to go ahead and be more aggressive with evaluation. Hysteroscopy with D and C and polyp resection was discussed.  There risks and benefits were described with the patient.  This is all documented in the office chart.  PROCEDURE:  Patient was taken to the operating room.  She was placed in supine position.  SCD are on her lower extremities bilaterally.  She has running IV in her left arm.  Anesthesia administered by the anesthesia staff without difficulty.  Dr. Cristela Blue oversaw the case.  The legs were then positioned in the low lithotomy position in [place] stirrups. There legs were lifted to the high lithotomy position.  Perineum, inner thighs, and vagina were prepped and draped in normal sterile fashion. Betadine prep was used.  Bivalve speculum was placed in the vagina.  The anterior lip of the cervix was grasped with single-tooth tenaculum. Paracervical block of 1% lidocaine mixed 1:1 with epinephrine (1:100,000 units), 10 mL total was used.  The uterus was then sounded to 7 cm.  The cervix was dilated up to #19.  A diagnostic  hysteroscope was obtained. This was advanced to the cervical canal.  A submucous fibroid that appears to calcifications in it was noted.  However, there was fairly large bilobed looking polyp.  The cervix was dilated up to a #23.  Using polyp forceps with multiple attempts at grasping the polyp, the polyp was ultimately removed in 3 fairly substantial pieces.  Ultimately, polyp was approximately 3 cm in size. A #1 tooth curette was used to curette the endometrial cavity until a gritty texture was noted in all quadrants.  There was minimal endometrial tissue was obtained.  The hysteroscope was used to re- visualize the cavity.  The cavity was smooth posteriorly.  The submucous fibroid was still noted anteriorly and photo documentation was made.  At this point, the hysteroscope was removed from the cervix.   Minimal bleeding is noted.  The tenaculum was removed from the anterior lip of the cervix.  Minimal bleeding was noted.  The Betadine prep was cleansed off the patient's skin.  Her legs were positioned back in supine position.  Sponge, lap, needle, and instrument counts were correct x2. The patient was awakened from anesthesia and taken to the recovery room in stable condition.     Lum Keas, MD     MSM/MEDQ  D:  05/11/2011  T:  05/11/2011  Job:  [id number]

## 2011-05-13 NOTE — Transfer of Care (Signed)
Immediate Anesthesia Transfer of Care Note  Patient: Angelica Mclaughlin  Procedure(s) Performed:  DILATATION & CURRETTAGE/HYSTEROSCOPY WITH RESECTOCOPE  Patient Location: PACU  Anesthesia Type: General  Level of Consciousness: patient cooperative  Airway & Oxygen Therapy: Patient connected to nasal cannula oxygen  Post-op Assessment: Report given to PACU RN and Post -op Vital signs reviewed and stable  Post vital signs: Reviewed and stable  Complications: No apparent anesthesia complications

## 2012-06-23 ENCOUNTER — Ambulatory Visit (INDEPENDENT_AMBULATORY_CARE_PROVIDER_SITE_OTHER): Payer: 59 | Admitting: *Deleted

## 2012-06-23 DIAGNOSIS — R609 Edema, unspecified: Secondary | ICD-10-CM

## 2012-06-23 DIAGNOSIS — M79609 Pain in unspecified limb: Secondary | ICD-10-CM

## 2013-05-30 ENCOUNTER — Other Ambulatory Visit: Payer: Self-pay | Admitting: Dermatology

## 2013-09-13 ENCOUNTER — Encounter: Payer: Self-pay | Admitting: Obstetrics & Gynecology

## 2013-09-13 ENCOUNTER — Encounter: Payer: Self-pay | Admitting: *Deleted

## 2013-09-14 ENCOUNTER — Ambulatory Visit (INDEPENDENT_AMBULATORY_CARE_PROVIDER_SITE_OTHER): Payer: 59 | Admitting: Obstetrics & Gynecology

## 2013-09-14 ENCOUNTER — Encounter: Payer: Self-pay | Admitting: Obstetrics & Gynecology

## 2013-09-14 VITALS — BP 120/68 | HR 72 | Resp 18 | Ht 65.0 in | Wt 153.0 lb

## 2013-09-14 DIAGNOSIS — Z01419 Encounter for gynecological examination (general) (routine) without abnormal findings: Secondary | ICD-10-CM

## 2013-09-14 DIAGNOSIS — Z124 Encounter for screening for malignant neoplasm of cervix: Secondary | ICD-10-CM

## 2013-09-14 NOTE — Progress Notes (Signed)
62 y.o. G0P0000 Married CaucasianF here for annual exam.  Separated for two years.  Living separately.  She is in a condo.  Very happy about this.   Seeing Dr. Felipa Eth next week.  Blood work scheduled then.  Will check about Tetanus shot then.   Patient's last menstrual period was 11/02/1996.          Sexually active: yes  The current method of family planning is tubal ligation.    Exercising: no  The patient does not participate in regular exercise at present. Smoker:  yes  Health Maintenance: Pap:  06/16/11 NEG HR HPV  History of abnormal Pap:  yes MMG:  09/20/12, scheduled for next week Colonoscopy:  8/13, Dr. Loreta Ave, f/u in 5 years BMD:   5/11, -1.2/-0.8 TDaP:  Not sure  Screening Labs: PCP, Hb today: PCP, Urine today: PCP   reports that she has been passively smoking.  She does not have any smokeless tobacco history on file. She reports that she drinks alcohol. She reports that she does not use illicit drugs.  Past Medical History  Diagnosis Date  . Dysrhythmia   . Hypertension   . Peripheral vascular disease     hx DVT '04  . Anxiety   . Depression   . H/O elevated lipids   . DVT (deep venous thrombosis)     H/O DVT on HRT  . H/O blood clots     on Coumadin    Past Surgical History  Procedure Laterality Date  . Breast surgery  '90's  . Tubal ligation      age 37  . Hysteroscopy w/d&c  05/2011    Polyp resection  . Ankle surgery Left     Current Outpatient Prescriptions  Medication Sig Dispense Refill  . DULoxetine (CYMBALTA) 60 MG capsule Take 120 mg by mouth daily.        Marland Kitchen ezetimibe (ZETIA) 10 MG tablet Take 10 mg by mouth daily.        . metoprolol (TOPROL-XL) 50 MG 24 hr tablet Take 50 mg by mouth daily.        Marland Kitchen olmesartan-hydrochlorothiazide (BENICAR HCT) 20-12.5 MG per tablet Take 1 tablet by mouth daily.        . vitamin B-12 (CYANOCOBALAMIN) 1000 MCG tablet Take 1,000 mcg by mouth daily.        . vitamin E 400 UNIT capsule Take 400 Units by mouth daily.         Marland Kitchen warfarin (COUMADIN) 5 MG tablet Take 2.5-5 mg by mouth daily. PATIENT TAKES 2.5MG  ON FRIDAYS AND 5 MG ALL OTHER DAYS        No current facility-administered medications for this visit.    Family History  Problem Relation Age of Onset  . Cancer Father     lung cancer, moved to brain  . Colon cancer Father   . Angina Father     ROS:  Pertinent items are noted in HPI.  Otherwise, a comprehensive ROS was negative.  Exam:   BP 120/68  Pulse 72  Resp 18  Ht 5\' 5"  (1.651 m)  Wt 153 lb (69.4 kg)  BMI 25.46 kg/m2  LMP 11/02/1996  Weight change: +4lb Height:   Height: 5\' 5"  (165.1 cm)  Ht Readings from Last 3 Encounters:  09/14/13 5\' 5"  (1.651 m)  05/04/11 5\' 5"  (1.651 m)    General appearance: alert, cooperative and appears stated age Head: Normocephalic, without obvious abnormality, atraumatic Neck: no adenopathy, supple, symmetrical, trachea midline  and thyroid normal to inspection and palpation Lungs: clear to auscultation bilaterally Breasts: normal appearance, no masses or tenderness Heart: regular rate and rhythm Abdomen: soft, non-tender; bowel sounds normal; no masses,  no organomegaly Extremities: extremities normal, atraumatic, no cyanosis or edema Skin: Skin color, texture, turgor normal. No rashes or lesions Lymph nodes: Cervical, supraclavicular, and axillary nodes normal. No abnormal inguinal nodes palpated Neurologic: Grossly normal   Pelvic: External genitalia:  no lesions              Urethra:  normal appearing urethra with no masses, tenderness or lesions              Bartholins and Skenes: normal                 Vagina: normal appearing vagina with normal color and discharge, no lesions              Cervix: no lesions              Pap taken: yes Bimanual Exam:  Uterus:  normal size, contour, position, consistency, mobility, non-tender              Adnexa: normal adnexa and no mass, fullness, tenderness               Rectovaginal: Confirms                Anus:  normal sphincter tone, no lesions  A:  Well Woman with normal exam PMP, no HRT H/O DVT, on chronic anticoagulation Elevate lipids Hypertension Family hx of colon cancer--father  P:   Mammogram yearly pap smear only today Labs with Dr. Felipa Eth.  Will check about tetanus shot. return annually or prn  An After Visit Summary was printed and given to the patient.

## 2013-09-14 NOTE — Patient Instructions (Signed)

## 2016-08-03 ENCOUNTER — Encounter: Payer: Self-pay | Admitting: Obstetrics & Gynecology

## 2016-08-03 ENCOUNTER — Ambulatory Visit (INDEPENDENT_AMBULATORY_CARE_PROVIDER_SITE_OTHER): Payer: 59 | Admitting: Obstetrics & Gynecology

## 2016-08-03 VITALS — BP 118/86 | HR 70 | Resp 16 | Ht 65.0 in | Wt 179.6 lb

## 2016-08-03 DIAGNOSIS — Z01419 Encounter for gynecological examination (general) (routine) without abnormal findings: Secondary | ICD-10-CM

## 2016-08-03 DIAGNOSIS — Z205 Contact with and (suspected) exposure to viral hepatitis: Secondary | ICD-10-CM

## 2016-08-03 DIAGNOSIS — Z124 Encounter for screening for malignant neoplasm of cervix: Secondary | ICD-10-CM | POA: Diagnosis not present

## 2016-08-03 NOTE — Progress Notes (Signed)
65 y.o. G0P0000 MarriedCaucasianF here for annual exam.  Doing well.  No vaginal bleeding.    PCP:  Dr. Dagmar Hait.  Saw him about a month ago.  Blood work was done at this visit.  Blood sugar is being monitored.    Patient's last menstrual period was 11/02/1996.          Sexually active: Yes.    The current method of family planning is post menopausal status.    Exercising: No.  The patient does not participate in regular exercise at present. Smoker:  no  Health Maintenance: Pap:  09/14/13 Pap smear negative History of abnormal Pap:  yes MMG:  10/24/15 BIRADS2 benign Colonoscopy:  8/13, Dr. Collene Mares, f/u in 5 years BMD:   2016/early 2017 at Dr. Dagmar Hait.  Pt reports this was good. TDaP: 2016  Pneumonia vaccine(s):  never Zostavax:   never Hep C testing: has discussed with PCP Screening Labs: PCP, Hb today: PCP, Urine today: unable to void at this time   reports that she has been smoking.  She has been smoking about 3.00 packs per day. She does not have any smokeless tobacco history on file. She reports that she drinks alcohol. She reports that she does not use drugs.  Past Medical History:  Diagnosis Date  . Anxiety   . Depression   . DVT (deep venous thrombosis)    H/O DVT on HRT  . Dysrhythmia   . H/O blood clots    on Coumadin  . H/O elevated lipids   . Hypertension   . Peripheral vascular disease    hx DVT '04    Past Surgical History:  Procedure Laterality Date  . ANKLE SURGERY Left   . BREAST SURGERY  '90's  . HYSTEROSCOPY W/D&C  05/2011   Polyp resection  . TUBAL LIGATION     age 59    Family History  Problem Relation Age of Onset  . Cancer Father     lung cancer, moved to brain  . Colon cancer Father   . Angina Father     ROS:  Pertinent items are noted in HPI.  Otherwise, a comprehensive ROS was negative.  Exam:   Vitals:   08/03/16 1357  BP: 118/86  Pulse: 70  Resp: 16   General appearance: alert, cooperative and appears stated age Head: Normocephalic,  without obvious abnormality, atraumatic Neck: no adenopathy, supple, symmetrical, trachea midline and thyroid normal to inspection and palpation Lungs: clear to auscultation bilaterally Breasts: normal appearance, no masses or tenderness Heart: regular rate and rhythm Abdomen: soft, non-tender; bowel sounds normal; no masses,  no organomegaly Extremities: extremities normal, atraumatic, no cyanosis or edema Skin: Skin color, texture, turgor normal. No rashes or lesions Lymph nodes: Cervical, supraclavicular, and axillary nodes normal. No abnormal inguinal nodes palpated Neurologic: Grossly normal  Pelvic: External genitalia:  no lesions              Urethra:  normal appearing urethra with no masses, tenderness or lesions              Bartholins and Skenes: normal                 Vagina: normal appearing vagina with normal color and discharge, no lesions              Cervix: no lesions              Pap taken: Yes.   Bimanual Exam:  Uterus:  normal size, contour, position,  consistency, mobility, non-tender              Adnexa: normal adnexa and no mass, fullness, tenderness               Rectovaginal: Confirms               Anus:  normal sphincter tone, no lesions  Chaperone was present for exam.  A:     Well Woman with normal exam PMP, no HRT H/O DVT, on chronic anticoagulation Elevate lipids Hypertension Family hx of colon cancer--father  P:         Mammogram yearly pap smear only today Labs/vaccines with Dr. Dagmar Hait Hep C antibody testing today return annually or prn

## 2016-08-04 LAB — HEPATITIS C ANTIBODY: HCV Ab: NEGATIVE

## 2016-08-06 LAB — IPS PAP TEST WITH HPV

## 2017-04-01 DIAGNOSIS — D2372 Other benign neoplasm of skin of left lower limb, including hip: Secondary | ICD-10-CM | POA: Diagnosis not present

## 2017-04-01 DIAGNOSIS — D2261 Melanocytic nevi of right upper limb, including shoulder: Secondary | ICD-10-CM | POA: Diagnosis not present

## 2017-04-01 DIAGNOSIS — D2361 Other benign neoplasm of skin of right upper limb, including shoulder: Secondary | ICD-10-CM | POA: Diagnosis not present

## 2017-04-01 DIAGNOSIS — L570 Actinic keratosis: Secondary | ICD-10-CM | POA: Diagnosis not present

## 2017-04-01 DIAGNOSIS — Z85828 Personal history of other malignant neoplasm of skin: Secondary | ICD-10-CM | POA: Diagnosis not present

## 2017-04-01 DIAGNOSIS — L918 Other hypertrophic disorders of the skin: Secondary | ICD-10-CM | POA: Diagnosis not present

## 2017-04-01 DIAGNOSIS — L821 Other seborrheic keratosis: Secondary | ICD-10-CM | POA: Diagnosis not present

## 2017-07-12 DIAGNOSIS — M859 Disorder of bone density and structure, unspecified: Secondary | ICD-10-CM | POA: Diagnosis not present

## 2017-07-12 DIAGNOSIS — R7302 Impaired glucose tolerance (oral): Secondary | ICD-10-CM | POA: Diagnosis not present

## 2017-07-12 DIAGNOSIS — I10 Essential (primary) hypertension: Secondary | ICD-10-CM | POA: Diagnosis not present

## 2017-07-21 DIAGNOSIS — D126 Benign neoplasm of colon, unspecified: Secondary | ICD-10-CM | POA: Diagnosis not present

## 2017-07-21 DIAGNOSIS — M859 Disorder of bone density and structure, unspecified: Secondary | ICD-10-CM | POA: Diagnosis not present

## 2017-07-21 DIAGNOSIS — R7302 Impaired glucose tolerance (oral): Secondary | ICD-10-CM | POA: Diagnosis not present

## 2017-07-21 DIAGNOSIS — I5032 Chronic diastolic (congestive) heart failure: Secondary | ICD-10-CM | POA: Diagnosis not present

## 2017-07-21 DIAGNOSIS — J449 Chronic obstructive pulmonary disease, unspecified: Secondary | ICD-10-CM | POA: Diagnosis not present

## 2017-07-21 DIAGNOSIS — Z0000 Encounter for general adult medical examination without abnormal findings: Secondary | ICD-10-CM | POA: Diagnosis not present

## 2017-07-21 DIAGNOSIS — I8290 Acute embolism and thrombosis of unspecified vein: Secondary | ICD-10-CM | POA: Diagnosis not present

## 2017-07-21 DIAGNOSIS — R69 Illness, unspecified: Secondary | ICD-10-CM | POA: Diagnosis not present

## 2017-07-21 DIAGNOSIS — I10 Essential (primary) hypertension: Secondary | ICD-10-CM | POA: Diagnosis not present

## 2017-07-21 DIAGNOSIS — E784 Other hyperlipidemia: Secondary | ICD-10-CM | POA: Diagnosis not present

## 2017-07-24 DIAGNOSIS — I10 Essential (primary) hypertension: Secondary | ICD-10-CM | POA: Diagnosis not present

## 2017-07-24 DIAGNOSIS — E669 Obesity, unspecified: Secondary | ICD-10-CM | POA: Diagnosis not present

## 2017-07-24 DIAGNOSIS — G4700 Insomnia, unspecified: Secondary | ICD-10-CM | POA: Diagnosis not present

## 2017-07-24 DIAGNOSIS — I998 Other disorder of circulatory system: Secondary | ICD-10-CM | POA: Diagnosis not present

## 2017-07-24 DIAGNOSIS — E785 Hyperlipidemia, unspecified: Secondary | ICD-10-CM | POA: Diagnosis not present

## 2017-07-24 DIAGNOSIS — H259 Unspecified age-related cataract: Secondary | ICD-10-CM | POA: Diagnosis not present

## 2017-07-24 DIAGNOSIS — Z6830 Body mass index (BMI) 30.0-30.9, adult: Secondary | ICD-10-CM | POA: Diagnosis not present

## 2017-07-24 DIAGNOSIS — Z0000 Encounter for general adult medical examination without abnormal findings: Secondary | ICD-10-CM | POA: Diagnosis not present

## 2017-07-24 DIAGNOSIS — R69 Illness, unspecified: Secondary | ICD-10-CM | POA: Diagnosis not present

## 2017-07-24 DIAGNOSIS — R0602 Shortness of breath: Secondary | ICD-10-CM | POA: Diagnosis not present

## 2017-11-19 ENCOUNTER — Encounter: Payer: Self-pay | Admitting: Obstetrics & Gynecology

## 2017-11-19 ENCOUNTER — Ambulatory Visit: Payer: 59 | Admitting: Obstetrics & Gynecology

## 2017-11-19 NOTE — Progress Notes (Deleted)
67 y.o. G0P0000 Unknown{Race/ethnicity:17218}F here for annual exam.    Patient's last menstrual period was 11/02/1996.          Sexually active: {yes no:314532}  The current method of family planning is {contraception:315051}.    Exercising: {yes no:314532}  {types:19826} Smoker:  {YES [id number]  Health Maintenance: Pap:  08/03/16 Neg. HR HPV:neg   09/14/13 Neg  History of abnormal Pap:  Yes, 2012  MMG:  10/28/16  Colonoscopy:  06/2012 Dr. Collene Mares f/u 5 years  BMD: 2017 with PCP TDaP:  2016 Pneumonia vaccine(s):  *** Shingrix:   *** Hep C testing: 08/03/16 Neg  Screening Labs: ***, Hb today: ***, Urine today: ***   reports that she quit smoking about 15 years ago. She smoked 3.00 packs per day. she has never used smokeless tobacco. She reports that she drinks alcohol. She reports that she does not use drugs.  Past Medical History:  Diagnosis Date  . Anxiety   . Depression   . DVT (deep venous thrombosis) ([facility])    H/O DVT on HRT  . Dysrhythmia   . H/O blood clots    on Coumadin  . H/O elevated lipids   . Hypertension   . Peripheral vascular disease ([facility])    hx DVT '04    Past Surgical History:  Procedure Laterality Date  . ANKLE SURGERY Left   . BREAST SURGERY  '90's  . HYSTEROSCOPY W/D&C  05/2011   Polyp resection  . TUBAL LIGATION     age 50    Current Outpatient Medications  Medication Sig Dispense Refill  . ALPRAZolam (XANAX) 0.5 MG tablet     . BREO ELLIPTA 100-25 MCG/INH AEPB     . DULoxetine (CYMBALTA) 60 MG capsule Take 120 mg by mouth daily.      Marland Kitchen ezetimibe (ZETIA) 10 MG tablet Take 10 mg by mouth daily.      . metoprolol (TOPROL-XL) 50 MG 24 hr tablet Take 50 mg by mouth daily.      Marland Kitchen olmesartan-hydrochlorothiazide (BENICAR HCT) 20-12.5 MG per tablet Take 1 tablet by mouth daily.      Marland Kitchen PROAIR HFA 108 (90 Base) MCG/ACT inhaler     . vitamin B-12 (CYANOCOBALAMIN) 1000 MCG tablet Take 1,000 mcg by mouth daily.      . vitamin E 400 UNIT capsule Take 400  Units by mouth daily.      Alveda Reasons 20 MG TABS tablet      No current facility-administered medications for this visit.     Family History  Problem Relation Age of Onset  . Cancer Father        lung cancer, moved to brain  . Colon cancer Father   . Angina Father     ROS:  Pertinent items are noted in HPI.  Otherwise, a comprehensive ROS was negative.  Exam:   LMP 11/02/1996   Weight change: @WEIGHTCHANGE @ Height:      Ht Readings from Last 3 Encounters:  08/03/16 5\' 5"  (1.651 m)  09/14/13 5\' 5"  (1.651 m)  05/04/11 5\' 5"  (1.651 m)    General appearance: alert, cooperative and appears stated age Head: Normocephalic, without obvious abnormality, atraumatic Neck: no adenopathy, supple, symmetrical, trachea midline and thyroid {EXAM; THYROID:18604} Lungs: clear to auscultation bilaterally Breasts: {Exam; breast:13139::"normal appearance, no masses or tenderness"} Heart: regular rate and rhythm Abdomen: soft, non-tender; bowel sounds normal; no masses,  no organomegaly Extremities: extremities normal, atraumatic, no cyanosis or edema Skin: Skin color, texture, turgor normal. No  rashes or lesions Lymph nodes: Cervical, supraclavicular, and axillary nodes normal. No abnormal inguinal nodes palpated Neurologic: Grossly normal   Pelvic: External genitalia:  no lesions              Urethra:  normal appearing urethra with no masses, tenderness or lesions              Bartholins and Skenes: normal                 Vagina: normal appearing vagina with normal color and discharge, no lesions              Cervix: {exam; cervix:14595}              Pap taken: {yes no:314532} Bimanual Exam:  Uterus:  {exam; uterus:12215}              Adnexa: {exam; adnexa:12223}               Rectovaginal: Confirms               Anus:  normal sphincter tone, no lesions  Chaperone was present for exam.  A:  Well Woman with normal exam  P:   {plan; gyn:5269::"mammogram","pap smear","return annually or  prn"}
